# Patient Record
Sex: Male | Born: 1990 | Race: White | Hispanic: No
Health system: Midwestern US, Community
[De-identification: ages and names within clinical notes are randomized; demographics above are authoritative.]

---

## 2009-01-06 ENCOUNTER — Emergency Department (HOSPITAL_COMMUNITY): Admission: EM | Admit: 2009-01-06 | Discharge: 2009-01-06 | Payer: Self-pay | Admitting: Emergency Medicine

## 2009-01-08 ENCOUNTER — Ambulatory Visit: Payer: Self-pay | Admitting: Family Medicine

## 2009-01-08 DIAGNOSIS — S060X0A Concussion without loss of consciousness, initial encounter: Secondary | ICD-10-CM

## 2009-01-08 DIAGNOSIS — S022XXA Fracture of nasal bones, initial encounter for closed fracture: Secondary | ICD-10-CM | POA: Insufficient documentation

## 2009-01-11 ENCOUNTER — Telehealth: Payer: Self-pay | Admitting: Family Medicine

## 2009-01-12 ENCOUNTER — Encounter: Payer: Self-pay | Admitting: Sports Medicine

## 2009-01-12 ENCOUNTER — Ambulatory Visit (HOSPITAL_COMMUNITY): Admission: RE | Admit: 2009-01-12 | Discharge: 2009-01-12 | Payer: Self-pay | Admitting: Otolaryngology

## 2011-10-26 IMAGING — CR DG MANDIBLE 4+V
4 series · 4 of 4 positions shown · non-contrast
Comparison: [facility] 01/12/2009.

CLINICAL DATA: Injury to left jaw.  Pain.

MANDIBLE - 4+ VIEW

[w mandible pa]
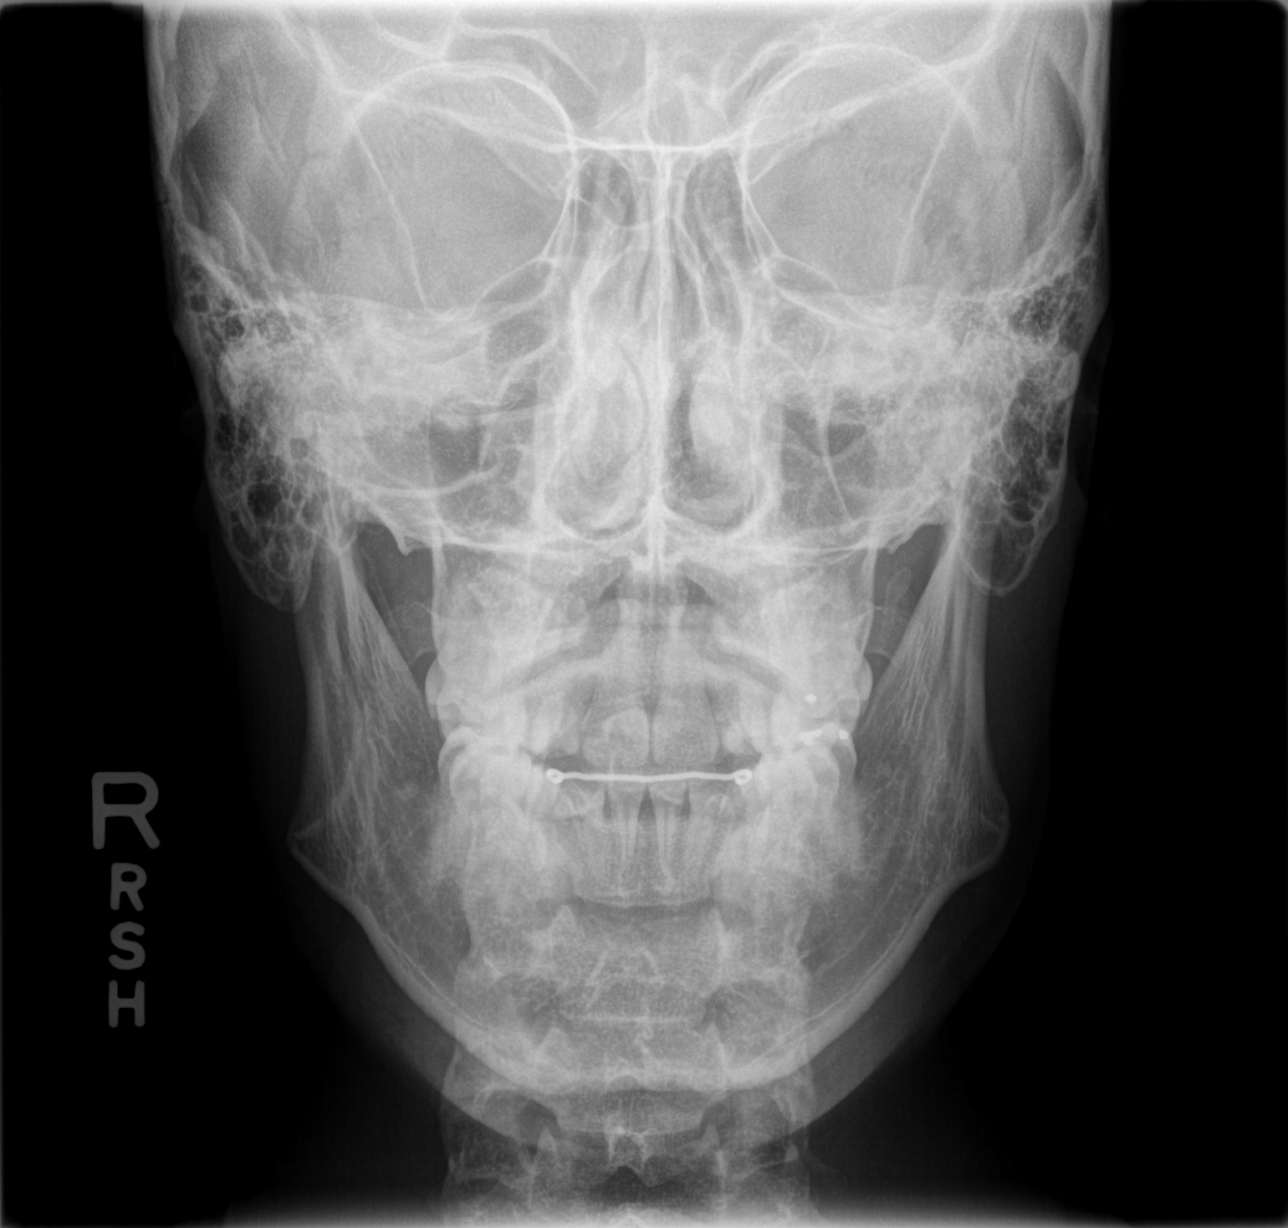

[w mandible,obl.]
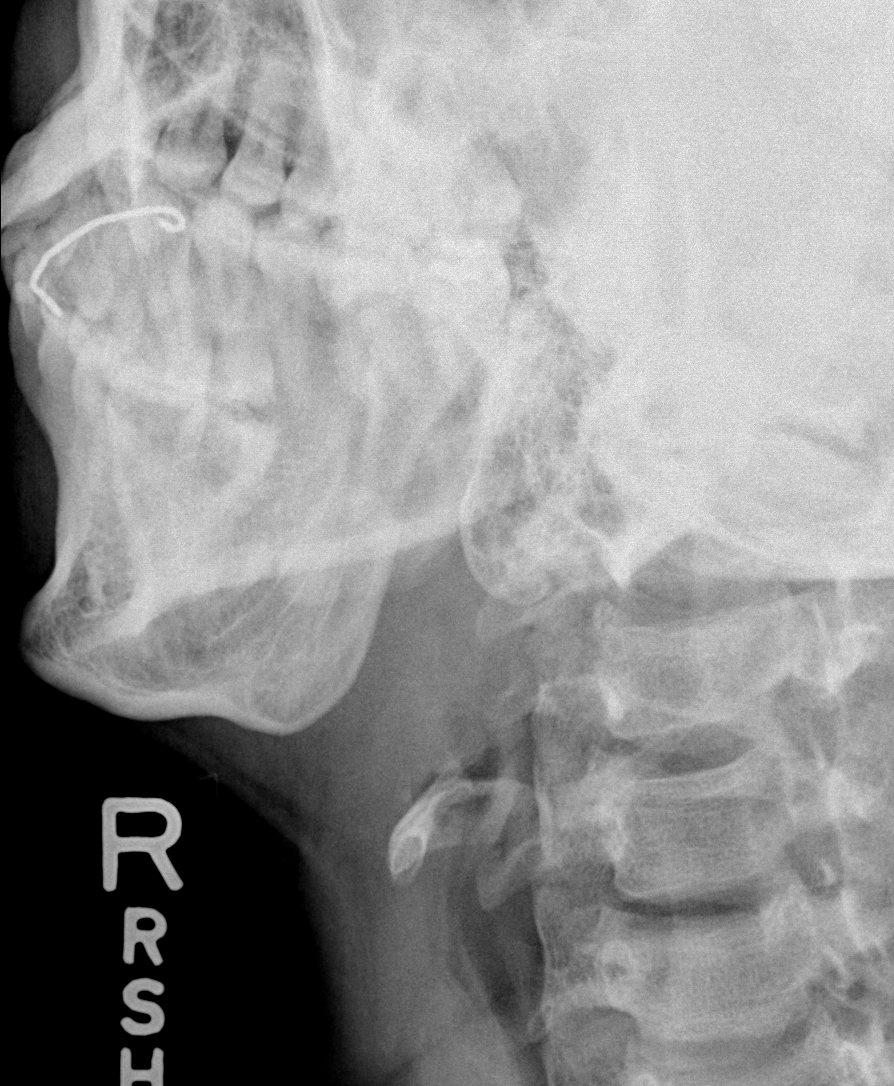

[w mandible,obl. * (1 of 2)]
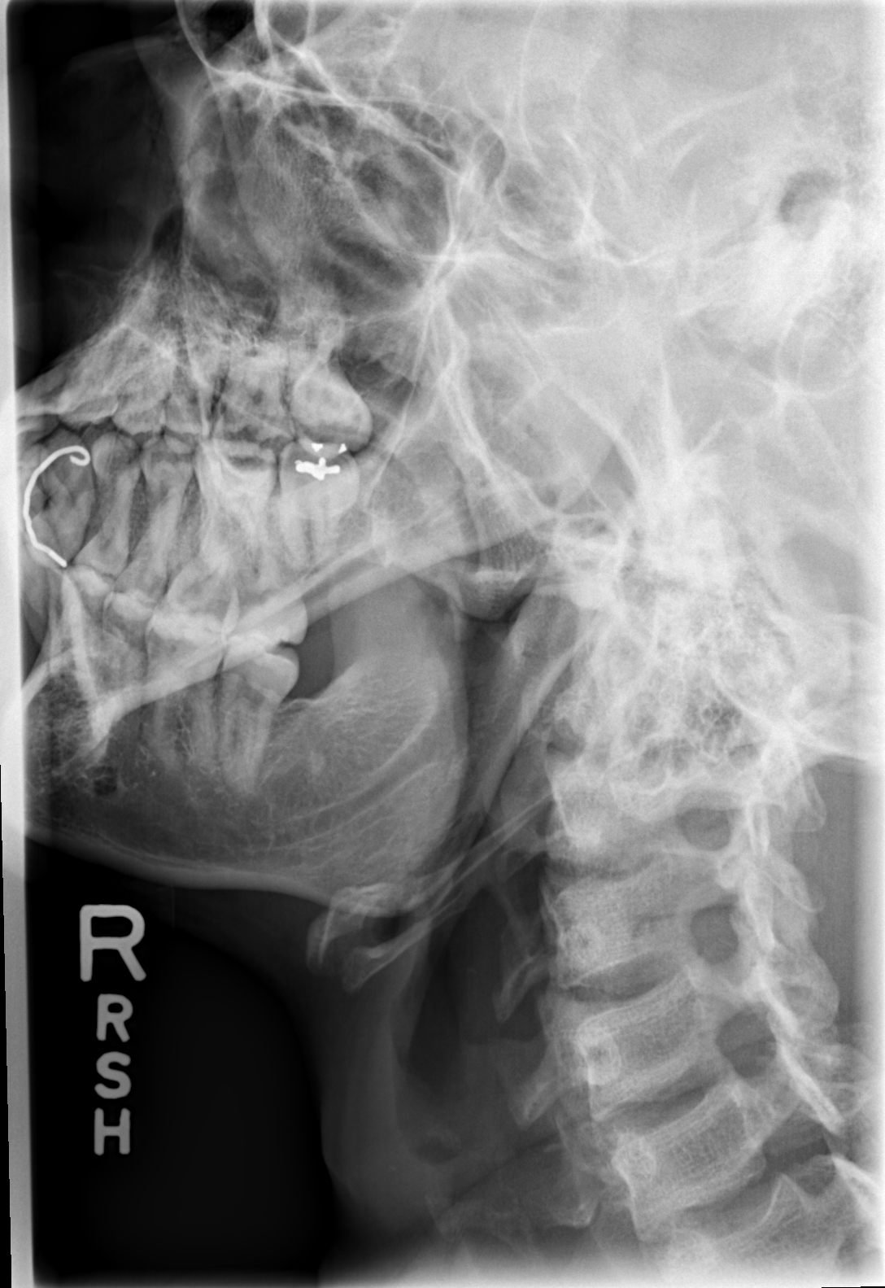

[w mandible,obl. * (2 of 2)]
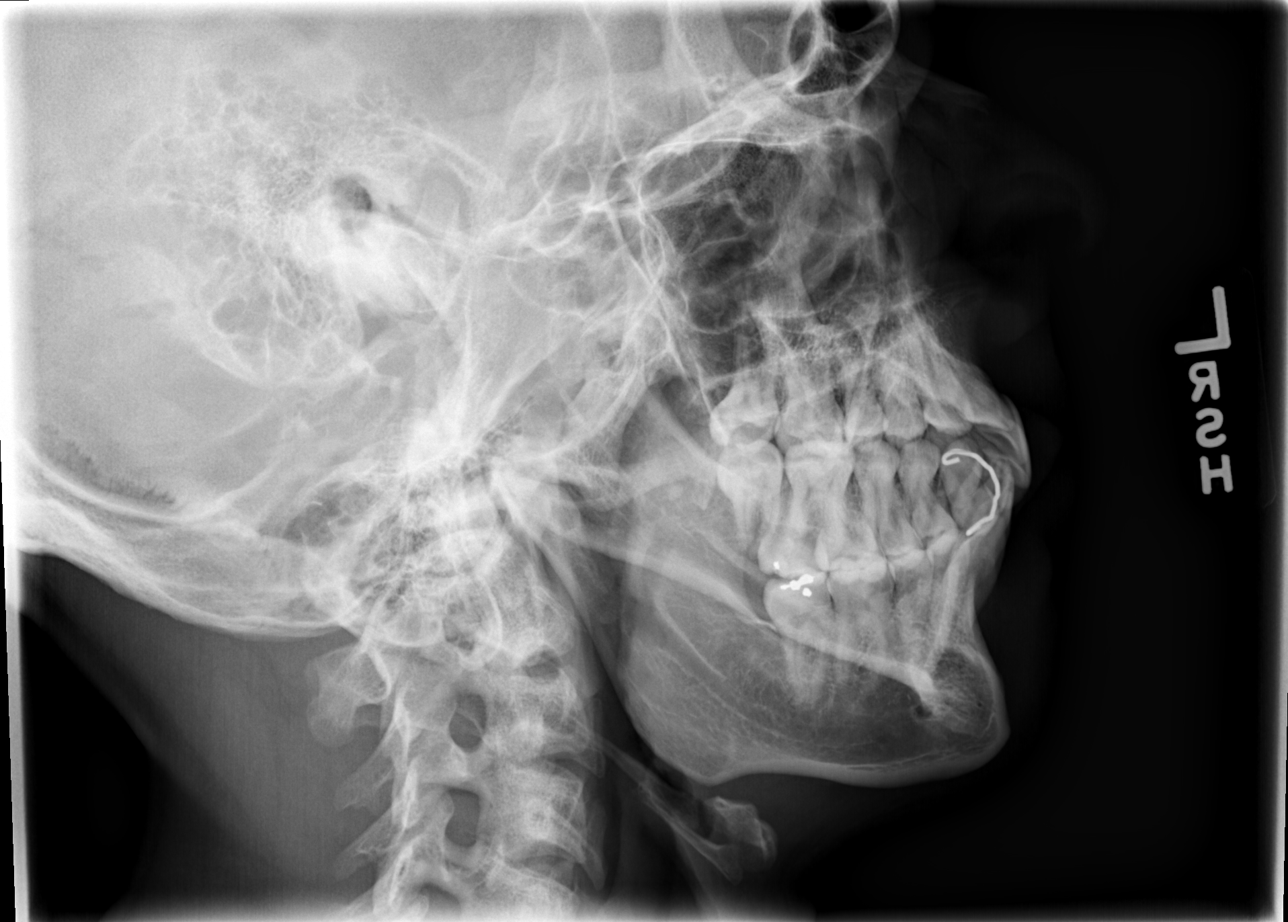

[4 of 4 positions shown; findings below may reference images not displayed]

FINDINGS: Negative for fracture of the mandible.  The TMJ appears
in  normal alignment bilaterally.  No acute bony abnormality.
IMPRESSION: Negative

## 2013-07-29 ENCOUNTER — Telehealth (INDEPENDENT_AMBULATORY_CARE_PROVIDER_SITE_OTHER): Payer: Self-pay

## 2013-07-29 NOTE — Telephone Encounter (Signed)
Erroneous encounter

## 2020-05-19 ENCOUNTER — Ambulatory Visit: Attending: Internal Medicine | Primary: Internal Medicine

## 2020-05-19 ENCOUNTER — Ambulatory Visit
Admit: 2020-05-19 | Discharge: 2020-05-19 | Payer: PRIVATE HEALTH INSURANCE | Attending: Internal Medicine | Primary: Internal Medicine

## 2020-05-19 DIAGNOSIS — L649 Androgenic alopecia, unspecified: Secondary | ICD-10-CM

## 2020-05-19 MED ORDER — FLUTICASONE 0.05 % TOPICAL CREAM
0.05 % | Freq: Two times a day (BID) | CUTANEOUS | 0 refills | Status: DC
Start: 2020-05-19 — End: 2020-08-18

## 2020-05-19 NOTE — Progress Notes (Signed)
 1. Have you been to the ER, urgent care clinic since your last visit?  Hospitalized since your last visit?no    2. Have you seen or consulted any other health care providers outside of the [facility] since your last visit?  Include any pap smears or colon screening. no    Chief Complaint   Patient presents with   . Establish Care   . Skin Problem     3 most recent PHQ Screens 05/19/2020   Little interest or pleasure in doing things Not at all   Feeling down, depressed, irritable, or hopeless Not at all   Total Score PHQ 2 0

## 2020-05-19 NOTE — Progress Notes (Signed)
Office Visit Note:    Assessment/Plan:  1. Androgenic alopecia    2. Eczema, unspecified type    3. Healthcare maintenance    4. Fatigue, unspecified type    5. Encounter to establish care with new doctor    6. Screening for HIV (human immunodeficiency virus)    7. Encounter for hepatitis C screening test for low risk patient    8. Encounter for lipid screening for cardiovascular disease      1. Androgenic alopecia.-I will ask him to continue on finasteride, at 1 mg/day.  He apparently has a 5 mg tablets which he is cutting into 4.  2. Eczema-he apparently saw a dermatologist in the past who put him on a steroid cream which has helped him.  But he states his eczema is getting worse.  Will refer him to a dermatologist here.  3. Fatigue-etiology fatigue is not clear, will start with a TSH and a CBC and a BMP on him today.  No evidence of any joint synovitis at this time.      Health Maintenance:  Immunizations:  Tetanus: next visit  Covid: 3 moderna    Screening:  Hepatitis C: Ordered today  HIV: Ordered today  Depression: PHQ-2 negative  Exercise: good  Diet: fair  Sleep: good      Orders Placed This Encounter   ??? LIPID PANEL     Standing Status:   Future     Number of Occurrences:   1     Standing Expiration Date:   05/19/2021   ??? HIV 1/2 AG/AB, 4TH GENERATION,W RFLX CONFIRM     Standing Status:   Future     Number of Occurrences:   1     Standing Expiration Date:   05/19/2021   ??? HCV AB W/RFLX TO NAA     Standing Status:   Future     Number of Occurrences:   1     Standing Expiration Date:   05/19/2021   ??? CBC W/O DIFF     Standing Status:   Future     Number of Occurrences:   1     Standing Expiration Date:   05/19/2021   ??? METABOLIC PANEL, BASIC     Standing Status:   Future     Number of Occurrences:   1     Standing Expiration Date:   05/19/2021   ??? TSH 3RD GENERATION     Standing Status:   Future     Number of Occurrences:   1     Standing Expiration Date:   05/19/2021   ??? REFERRAL TO DERMATOLOGY     Referral Priority:    Routine     Referral Type:   Consultation     Referral Reason:   Specialty Services Required     Referred to Provider:   Lucia Estelle, MD     Requested Specialty:   Dermatology     Number of Visits Requested:   1   ??? finasteride (PROSCAR) 5 mg tablet     Sig: Take 5 mg by mouth daily.     Social Determinants of Health     Tobacco Use: High Risk   ??? Smoking Tobacco Use: Current Some Day Smoker   ??? Smokeless Tobacco Use: Former Neurosurgeon   Alcohol Use:    ??? Frequency of Alcohol Consumption: Not on file   ??? Average Number of Drinks: Not on file   ??? Frequency of Binge Drinking: Not  on file   Financial Resource Strain:    ??? Difficulty of Paying Living Expenses: Not on file   Food Insecurity:    ??? Worried About Running Out of Food in the Last Year: Not on file   ??? Ran Out of Food in the Last Year: Not on file   Transportation Needs:    ??? Lack of Transportation (Medical): Not on file   ??? Lack of Transportation (Non-Medical): Not on file   Physical Activity:    ??? Days of Exercise per Week: Not on file   ??? Minutes of Exercise per Session: Not on file   Stress:    ??? Feeling of Stress : Not on file   Social Connections:    ??? Frequency of Communication with Friends and Family: Not on file   ??? Frequency of Social Gatherings with Friends and Family: Not on file   ??? Attends Religious Services: Not on file   ??? Active Member of Clubs or Organizations: Not on file   ??? Attends Banker Meetings: Not on file   ??? Marital Status: Not on file   Intimate Partner Violence:    ??? Fear of Current or Ex-Partner: Not on file   ??? Emotionally Abused: Not on file   ??? Physically Abused: Not on file   ??? Sexually Abused: Not on file   Depression: Not at risk   ??? PHQ-2 Score: 0   Housing Stability:    ??? Unable to Pay for Housing in the Last Year: Not on file   ??? Number of Places Lived in the Last Year: Not on file   ??? Unstable Housing in the Last Year: Not on file       Follow-up and Dispositions    ?? Return in about 3 months (around  08/19/2020).         I have reviewed with the patient details of the assessment and plan and all questions were answered. Relevant patient education was performed. The most recent lab findings were reviewed with the patient.  An After Visit Summary was printed and given to the patient.    Reason for Visit: Establish Care and Skin Problem      Subjective:  30 y.o. male with h/o androgenic alopecia and eczema who comes to establish care with me.  He states that he feels fatigued all the time and has been gaining weight slowly for the last several years.  He has eczema around his years and at the hairlines and has seen a dermatologist in the past who has put him on a steroid cream.    Review of Systems  A complete 11 system ROS was preformed (constitutional, eyes, ENT, cardiovascular, respiratory, gastrointestinal, genitourinary, musculoskeletal, skin, neurological, psychiatric) and was negative aside from the pertinent positives and negatives noted in the HPI.    Past Medical History:   Diagnosis Date   ??? Contact dermatitis and eczema due to cause      History reviewed. No pertinent surgical history.  Social History     Socioeconomic History   ??? Marital status: UNKNOWN   Tobacco Use   ??? Smoking status: Current Some Day Smoker     Types: Cigars   ??? Smokeless tobacco: Former Leisure centre manager   ??? Vaping Use: Former   Substance and Sexual Activity   ??? Alcohol use: Yes     Alcohol/week: 6.0 standard drinks     Types: 2 Glasses of wine, 2 Cans of beer,  2 Shots of liquor per week     Comment: 2-3 times a week     Family History   Problem Relation Age of Onset   ??? Immune Disorder Sister      Current Outpatient Medications   Medication Sig Dispense Refill   ??? finasteride (PROSCAR) 5 mg tablet Take 5 mg by mouth daily.       No Known Allergies    Objective:  Visit Vitals  BP 102/70   Pulse 65   Temp 98 ??F (36.7 ??C) (Oral)   Resp 16   Ht 5\' 11"  (1.803 m)   Wt 194 lb (88 kg)   SpO2 97%   BMI 27.06 kg/m??     Physical Exam:   AA&O  x3. Not pale, not in any distress.  HEENT: ENT negative.  Neck: Supple, no JVD or lymphadenopathy.  Lungs: clear  Heart: S1 S2 +, RRR  Abdomen: Soft, No tenderness  Neuro: No focal deficits.  Skin: No erythema or lesions noted.  Extremities: no pedal edema, good peripheral pulses  Psych: Normal affect and mood.  No results found for this or any previous visit.      , MD, FACP, [facility].  [facility]  [facility], [place], [place].

## 2020-05-20 LAB — METABOLIC PANEL, BASIC
BUN/Creatinine ratio: 13 (ref 9–20)
BUN: 14 mg/dL (ref 6–20)
CO2: 21 mmol/L (ref 20–29)
Calcium: 9.8 mg/dL (ref 8.7–10.2)
Chloride: 102 mmol/L (ref 96–106)
Creatinine: 1.09 mg/dL (ref 0.76–1.27)
Glucose: 90 mg/dL (ref 65–99)
Potassium: 4.8 mmol/L (ref 3.5–5.2)
Sodium: 139 mmol/L (ref 134–144)
eGFR: 92 mL/min/{1.73_m2} (ref >59)

## 2020-05-20 LAB — LIPID PANEL
Cholesterol, Total: 187 mg/dL (ref 100–199)
Cholesterol, total: 187 mg/dL (ref 100–199)
HDL Cholesterol: 46 mg/dL (ref >39)
HDL: 46 mg/dL (ref >39)
LDL Calculated: 118 mg/dL — ABNORMAL HIGH (ref 0–99)
LDL, calculated: 118 mg/dL — ABNORMAL HIGH (ref 0–99)
Triglyceride: 131 mg/dL (ref 0–149)
Triglycerides: 131 mg/dL (ref 0–149)
VLDL, calculated: 23 mg/dL (ref 5–40)
VLDL: 23 mg/dL (ref 5–40)

## 2020-05-20 LAB — CBC W/O DIFF
HCT: 46.9 % (ref 37.5–51.0)
HGB: 16 g/dL (ref 13.0–17.7)
MCH: 30.9 pg (ref 26.6–33.0)
MCHC: 34.1 g/dL (ref 31.5–35.7)
MCV: 91 fL (ref 79–97)
PLATELET: 294 10*3/uL (ref 150–450)
RBC: 5.18 x10E6/uL (ref 4.14–5.80)
RDW: 13 % (ref 11.6–15.4)
WBC: 9.3 10*3/uL (ref 3.4–10.8)

## 2020-05-20 LAB — CKD REPORT

## 2020-05-20 LAB — TSH 3RD GENERATION
TSH: 1.58 u[IU]/mL (ref 0.450–4.500)
TSH: 1.58 u[IU]/mL (ref 0.450–4.500)

## 2020-05-20 LAB — CVD REPORT

## 2020-05-20 LAB — HCV INTERPRETATION

## 2020-05-20 LAB — HCV AB W/RFLX TO NAA
HCV AB, 144035: <0.1 s/co ratio (ref 0.0–0.9)
HCV Ab: <0.1 s/co ratio (ref 0.0–0.9)

## 2020-05-20 LAB — HIV 1/2 AG/AB, 4TH GENERATION,W RFLX CONFIRM: HIV SCREEN 4TH GENERATION WRFX: NONREACTIVE

## 2020-05-20 LAB — CBC
Hematocrit: 46.9 % (ref 37.5–51.0)
Hemoglobin: 16 g/dL (ref 13.0–17.7)
MCH: 30.9 pg (ref 26.6–33.0)
MCHC: 34.1 g/dL (ref 31.5–35.7)
MCV: 91 fL (ref 79–97)
Platelets: 294 10*3/uL (ref 150–450)
RBC: 5.18 x10E6/uL (ref 4.14–5.80)
RDW: 13 % (ref 11.6–15.4)
WBC: 9.3 10*3/uL (ref 3.4–10.8)

## 2020-05-20 LAB — BASIC METABOLIC PANEL
BUN: 14 mg/dL (ref 6–20)
Bun/Cre Ratio: 13 NA (ref 9–20)
CO2: 21 mmol/L (ref 20–29)
Calcium: 9.8 mg/dL (ref 8.7–10.2)
Chloride: 102 mmol/L (ref 96–106)
Creatinine: 1.09 mg/dL (ref 0.76–1.27)
Est, Glomerular Filtration Rate: 92 mL/min/{1.73_m2} (ref >59)
Glucose: 90 mg/dL (ref 65–99)
Potassium: 4.8 mmol/L (ref 3.5–5.2)
Sodium: 139 mmol/L (ref 134–144)

## 2020-05-20 LAB — HIV 1/2 ANTIGEN/ANTIBODY, FOURTH GENERATION W/RFL: HIV Screen 4th Generation wRfx: NONREACTIVE

## 2020-08-18 ENCOUNTER — Ambulatory Visit: Attending: Internal Medicine | Primary: Internal Medicine

## 2020-08-18 ENCOUNTER — Ambulatory Visit
Admit: 2020-08-18 | Discharge: 2020-08-18 | Payer: PRIVATE HEALTH INSURANCE | Attending: Internal Medicine | Primary: Internal Medicine

## 2020-08-18 DIAGNOSIS — L649 Androgenic alopecia, unspecified: Secondary | ICD-10-CM

## 2020-08-18 MED ORDER — FLUTICASONE 0.05 % TOPICAL CREAM
0.05 % | Freq: Two times a day (BID) | CUTANEOUS | 2 refills | Status: AC
Start: 2020-08-18 — End: ?

## 2020-08-18 NOTE — Progress Notes (Signed)
1. Have you been to the ER, urgent care clinic since your last visit?  Hospitalized since your last visit?no    2. Have you seen or consulted any other health care providers outside of the [facility] since your last visit?  Include any pap smears or colon screening. No    Chief Complaint   Patient presents with   . Hair/Scalp Problem   . Dry Skin     3 most recent PHQ Screens 05/19/2020   Little interest or pleasure in doing things Not at all   Feeling down, depressed, irritable, or hopeless Not at all   Total Score PHQ 2 0

## 2020-08-18 NOTE — Progress Notes (Signed)
Office Visit Note:    Assessment/Plan:  1. Androgenic alopecia    2. Eczema, unspecified type    3. Fatigue, unspecified type    4. Healthcare maintenance      1. Androgenic alopecia- continue on finasteride, at 1 mg/day.  He apparently has a 5 mg tablets which he is cutting into 4.  2. Eczema-he apparently saw a dermatologist in the past who put him on a steroid cream which has helped him.  He was referred to a dermatologist last visit, but he has not yet made the appointment.  Asked him to make the appointment, will give him a refill on his steroid cream.  3. Fatigue-he states that his fatigue is completely resolved since he started working out.  His work-up including TSH, CBC and CMP was within normal limits.    Health Maintenance:  Immunizations:  Tetanus: Due, will plan to give it next visit  Covid: 3 moderna    Screening:  Hepatitis C: negative  HIV: negative  Depression: PHQ-2 negative  Exercise: good  Diet: fair  Sleep: good      Orders Placed This Encounter   ??? fluticasone propionate (CUTIVATE) 0.05 % topical cream     Sig: Apply  to affected area two (2) times a day.     Dispense:  60 g     Refill:  2     Social Determinants of Health     Tobacco Use: High Risk   ??? Smoking Tobacco Use: Current Some Day Smoker   ??? Smokeless Tobacco Use: Former Systems developer   Alcohol Use:    ??? Frequency of Alcohol Consumption: Not on file   ??? Average Number of Drinks: Not on file   ??? Frequency of Binge Drinking: Not on file   Financial Resource Strain:    ??? Difficulty of Paying Living Expenses: Not on file   Food Insecurity:    ??? Worried About Running Out of Food in the Last Year: Not on file   ??? Ran Out of Food in the Last Year: Not on file   Transportation Needs:    ??? Lack of Transportation (Medical): Not on file   ??? Lack of Transportation (Non-Medical): Not on file   Physical Activity:    ??? Days of Exercise per Week: Not on file   ??? Minutes of Exercise per Session: Not on file   Stress:    ??? Feeling of Stress : Not on file    Social Connections:    ??? Frequency of Communication with Friends and Family: Not on file   ??? Frequency of Social Gatherings with Friends and Family: Not on file   ??? Attends Religious Services: Not on file   ??? Active Member of Clubs or Organizations: Not on file   ??? Attends Archivist Meetings: Not on file   ??? Marital Status: Not on file   Intimate Partner Violence:    ??? Fear of Current or Ex-Partner: Not on file   ??? Emotionally Abused: Not on file   ??? Physically Abused: Not on file   ??? Sexually Abused: Not on file   Depression: Not at risk   ??? PHQ-2 Score: 0   Housing Stability:    ??? Unable to Pay for Housing in the Last Year: Not on file   ??? Number of Places Lived in the Last Year: Not on file   ??? Unstable Housing in the Last Year: Not on file       Follow-up  and Dispositions    ?? Return in about 6 months (around 02/17/2021).         I have reviewed with the patient details of the assessment and plan and all questions were answered. Relevant patient education was performed. The most recent lab findings were reviewed with the patient.  An After Visit Summary was printed and given to the patient.    Reason for Visit: Hair/Scalp Problem and Dry Skin      Subjective:  30 y.o. male with h/o androgenic alopecia and eczema who comes for follow-up.  He states that his fatigue is completely resolved.  He started working out.  His eczema has remained fairly stable.  He has not yet made an appointment with a dermatologist.    Review of Systems  A complete 11 system ROS was preformed (constitutional, eyes, ENT, cardiovascular, respiratory, gastrointestinal, genitourinary, musculoskeletal, skin, neurological, psychiatric) and was negative aside from the pertinent positives and negatives noted in the HPI.    Past Medical History:   Diagnosis Date   ??? Contact dermatitis and eczema due to cause      History reviewed. No pertinent surgical history.  Social History     Socioeconomic History   ??? Marital status: UNKNOWN    Tobacco Use   ??? Smoking status: Current Some Day Smoker     Types: Cigars   ??? Smokeless tobacco: Former Engineer, maintenance (IT)   ??? Vaping Use: Former   Substance and Sexual Activity   ??? Alcohol use: Yes     Alcohol/week: 6.0 standard drinks     Types: 2 Glasses of wine, 2 Cans of beer, 2 Shots of liquor per week     Comment: 2-3 times a week     Family History   Problem Relation Age of Onset   ??? Immune Disorder Sister    ??? Bleeding Prob Paternal Uncle      Current Outpatient Medications   Medication Sig Dispense Refill   ??? fluticasone propionate (CUTIVATE) 0.05 % topical cream Apply  to affected area two (2) times a day. 60 g 2   ??? finasteride (PROSCAR) 5 mg tablet Take 5 mg by mouth daily.       No Known Allergies    Objective:  Visit Vitals  BP 114/68   Pulse 75   Temp 98 ??F (36.7 ??C) (Oral)   Resp 16   Ht '5\' 11"'$  (1.803 m)   Wt 187 lb (84.8 kg)   SpO2 97%   BMI 26.08 kg/m??     Physical Exam:   AA&O x3. Not pale, not in any distress.  HEENT: ENT negative.  Neck: Supple, no JVD or lymphadenopathy.  Lungs: clear  Heart: S1 S2 +, RRR  Abdomen: Soft, No tenderness  Neuro: No focal deficits.  Skin: No erythema or lesions noted.  Extremities: no pedal edema, good peripheral pulses  Psych: Normal affect and mood.  Results for orders placed or performed in visit on 05/19/20   LIPID PANEL   Result Value Ref Range    Cholesterol, total 187 100 - 199 mg/dL    Triglyceride 131 0 - 149 mg/dL    HDL Cholesterol 46 >39 mg/dL    VLDL, calculated 23 5 - 40 mg/dL    LDL, calculated 118 (H) 0 - 99 mg/dL   HIV 1/2 AG/AB, 4TH GENERATION,W RFLX CONFIRM   Result Value Ref Range    HIV SCREEN 4TH GENERATION WRFX Non Reactive Non Reactive   HCV  AB W/RFLX TO NAA   Result Value Ref Range    HCV Ab <0.1 0.0 - 0.9 s/co ratio   CBC W/O DIFF   Result Value Ref Range    WBC 9.3 3.4 - 10.8 x10E3/uL    RBC 5.18 4.14 - 5.80 x10E6/uL    HGB 16.0 13.0 - 17.7 g/dL    HCT 46.9 37.5 - 51.0 %    MCV 91 79 - 97 fL    MCH 30.9 26.6 - 33.0 pg    MCHC 34.1 31.5  - 35.7 g/dL    RDW 13.0 11.6 - 15.4 %    PLATELET 294 150 - 450 [id number]   METABOLIC PANEL, BASIC   Result Value Ref Range    Glucose 90 65 - 99 mg/dL    BUN 14 6 - 20 mg/dL    Creatinine 1.09 0.76 - 1.27 mg/dL    eGFR 92 >59 mL/min/1.73    BUN/Creatinine ratio 13 9 - 20    Sodium 139 134 - 144 mmol/L    Potassium 4.8 3.5 - 5.2 mmol/L    Chloride 102 96 - 106 mmol/L    CO2 21 20 - 29 mmol/L    Calcium 9.8 8.7 - 10.2 mg/dL   TSH 3RD GENERATION   Result Value Ref Range    TSH 1.580 0.450 - 4.500 uIU/mL   CVD REPORT   Result Value Ref Range    INTERPRETATION Note    CKD REPORT   Result Value Ref Range    Interpretation Note    HCV INTERPRETATION   Result Value Ref Range    HCV Interpretation Comment          Elpidio Anis, MD, FACP, [facility].  [place], [place], [place].

## 2021-05-26 NOTE — Telephone Encounter (Signed)
LVM FOR PT TO CALL [facility] AND SCHEDULE APPT WITH GURUNG

## 2021-05-26 NOTE — Telephone Encounter (Signed)
 Pt left a voice message requesting a new prescription for the Proscar 5 mg to be sent to the [facility].     [phone]    Please contact patient for scheduling. Thanks  Pt was a pt of Dr. MYRTIS Artist.     Last visit 08/18/2020 MD Artist   Next appointment Nothing scheduled       For Pharmacy Admin Tracking Only    Program: Medication Refill  Intervention Detail: New Rx: 1, reason: Patient Preference  Time Spent (min): 5      Requested Prescriptions     Pending Prescriptions Disp Refills    finasteride (PROSCAR) 5 mg tablet 30 Tablet 0     Sig: Take 1 Tablet by mouth daily.
